# Patient Record
Sex: Male | Born: 1975 | Hispanic: Refuse to answer | Marital: Single | Smoking: Never smoker
Health system: Southern US, Community
[De-identification: ages and names within clinical notes are randomized; demographics above are authoritative.]

## PROBLEM LIST (undated history)

## (undated) HISTORY — PX: MOUTH SURGERY: SHX715

---

## 2010-09-15 ENCOUNTER — Ambulatory Visit (INDEPENDENT_AMBULATORY_CARE_PROVIDER_SITE_OTHER): Payer: Managed Care, Other (non HMO) | Admitting: Internal Medicine

## 2010-09-15 ENCOUNTER — Encounter: Payer: Self-pay | Admitting: Internal Medicine

## 2010-09-15 DIAGNOSIS — N5089 Other specified disorders of the male genital organs: Secondary | ICD-10-CM

## 2010-09-15 DIAGNOSIS — Z0000 Encounter for general adult medical examination without abnormal findings: Secondary | ICD-10-CM | POA: Insufficient documentation

## 2010-09-15 LAB — LIPID PANEL
Cholesterol: 150 mg/dL (ref 0–200)
HDL: 54.2 mg/dL (ref >39.00)
LDL Cholesterol: 89 mg/dL (ref 0–99)
Triglycerides: 34 mg/dL (ref 0.0–149.0)
VLDL: 6.8 mg/dL (ref 0.0–40.0)

## 2010-09-15 LAB — BASIC METABOLIC PANEL
BUN: 13 mg/dL (ref 6–23)
Chloride: 103 mEq/L (ref 96–112)
GFR: 108.2 mL/min (ref >60.00)
Potassium: 4.4 mEq/L (ref 3.5–5.1)

## 2010-09-15 LAB — CBC WITH DIFFERENTIAL/PLATELET
Basophils Absolute: 0 10*3/uL (ref 0.0–0.1)
Eosinophils Absolute: 0 10*3/uL (ref 0.0–0.7)
Lymphocytes Relative: 25.2 % (ref 12.0–46.0)
MCHC: 34.7 g/dL (ref 30.0–36.0)
MCV: 97.3 fl (ref 78.0–100.0)
Monocytes Absolute: 0.4 10*3/uL (ref 0.1–1.0)
Neutrophils Relative %: 66.5 % (ref 43.0–77.0)
Platelets: 173 10*3/uL (ref 150.0–400.0)
RDW: 13.1 % (ref 11.5–14.6)

## 2010-09-15 LAB — TSH: TSH: 1.38 u[IU]/mL (ref 0.35–5.50)

## 2010-09-15 NOTE — Progress Notes (Signed)
  Subjective:    Patient ID: Luis Moore, male    DOB: October 14, 1975, 35 y.o.   MRN: [id number]  HPI new patient, physical exam. A few weeks ago he felt a bump at the right testicle, bump is not there any longer but he still has some heaviness.  No past medical history on file.  Past Surgical History  Procedure Date  . Mouth surgery     gums   History   Social History  . Marital Status: Single    Spouse Name: N/A    Number of Children: 0  . Years of Education: N/A   Occupational History  . financial services sales     Social History Main Topics  . Smoking status: Never Smoker   . Smokeless tobacco: Not on file  . Alcohol Use: Yes     rare  . Drug Use: No  . Sexually Active: Not on file   Other Topics Concern  . Not on file   Social History Narrative   Exercise: none but is active------Lives by himself    Family History  Problem Relation Age of Onset  . Melanoma Father   . Diabetes Father   . Hypertension Father      Review of Systems  Respiratory: Negative for cough and shortness of breath.   Cardiovascular: Negative for chest pain and leg swelling.  Gastrointestinal: Negative for abdominal pain and blood in stool.  Genitourinary: Negative for dysuria, hematuria and difficulty urinating.  Psychiatric/Behavioral:       No anxiety, depression       Objective:   Physical Exam  Constitutional: He appears well-developed and well-nourished. No distress.  HENT:  Head: Normocephalic and atraumatic.  Neck: No thyromegaly present.       Normal carotid pulses  Cardiovascular: Normal rate, regular rhythm and normal heart sounds.   No murmur heard. Pulmonary/Chest: Breath sounds normal. No respiratory distress. He has no wheezes. He has no rales.  Abdominal: Soft. He exhibits no distension. There is no tenderness. There is no rebound.  Genitourinary: Rectum normal and penis normal.       Both testicles are normal. He has some nodularities in the epididymis, more  noticeable on the right (distaly)  Skin: He is not diaphoretic.          Assessment & Plan:

## 2010-09-15 NOTE — Assessment & Plan Note (Signed)
epididymal cyst? U/s to confirm

## 2010-09-15 NOTE — Assessment & Plan Note (Addendum)
Td--declined  Discussed diet-exercise-STE Labs

## 2010-09-20 ENCOUNTER — Ambulatory Visit
Admission: RE | Admit: 2010-09-20 | Discharge: 2010-09-20 | Disposition: A | Discharge status: Home / Self Care | Payer: Managed Care, Other (non HMO) | Source: Ambulatory Visit | Attending: Internal Medicine | Admitting: Internal Medicine

## 2010-09-20 DIAGNOSIS — N5089 Other specified disorders of the male genital organs: Secondary | ICD-10-CM

## 2010-09-21 ENCOUNTER — Telehealth: Payer: Self-pay | Admitting: *Deleted

## 2010-09-21 NOTE — Telephone Encounter (Signed)
Message copied by Army Fossa on Thu Sep 21, 2010  8:47 AM ------      Message from: Luis Moore      Created: Wed Sep 20, 2010  5:27 PM       Advise patient:      Ultrasound is normal, patient to let me know if anything changes

## 2010-09-21 NOTE — Telephone Encounter (Signed)
Message left for patient to return my call.  

## 2010-09-22 NOTE — Telephone Encounter (Signed)
Pt is aware.  

## 2011-12-03 ENCOUNTER — Ambulatory Visit: Payer: Managed Care, Other (non HMO) | Admitting: Family Medicine

## 2011-12-03 VITALS — BP 108/64 | HR 68 | Temp 98.0°F | Resp 16 | Ht 65.0 in | Wt 168.0 lb

## 2011-12-03 DIAGNOSIS — T148 Other injury of unspecified body region: Secondary | ICD-10-CM

## 2011-12-03 DIAGNOSIS — W57XXXA Bitten or stung by nonvenomous insect and other nonvenomous arthropods, initial encounter: Secondary | ICD-10-CM

## 2011-12-03 MED ORDER — CEPHALEXIN 500 MG PO CAPS: 500.0000 mg | ORAL_CAPSULE | Freq: Two times a day (BID) | ORAL | Status: AC

## 2011-12-03 NOTE — Progress Notes (Signed)
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  The area has become reddened rapidly and is tender to touch, though it does not hurt when it is not palpated  O:  Red 1/2 area on superior left scapula with central small puncture site  A:  Insect bite  1. Insect bite  cephALEXin (KEFLEX) 500 MG capsule

## 2011-12-05 ENCOUNTER — Ambulatory Visit: Payer: Managed Care, Other (non HMO)

## 2011-12-10 ENCOUNTER — Other Ambulatory Visit: Payer: Self-pay | Admitting: Family Medicine

## 2012-05-02 IMAGING — US US SCROTUM
1 series · 14 of 25 positions shown · non-contrast
Comparison: None.

CLINICAL DATA: Testicular heaviness.  Question epididymal cyst.

ULTRASOUND OF SCROTUM
TECHNIQUE: Complete ultrasound examination of the testicles,
epididymis, and other scrotal structures was performed.

[Series 1: us scrotum · 0.07mm/px · 14 of 32 slices shown]
[im 1/32]
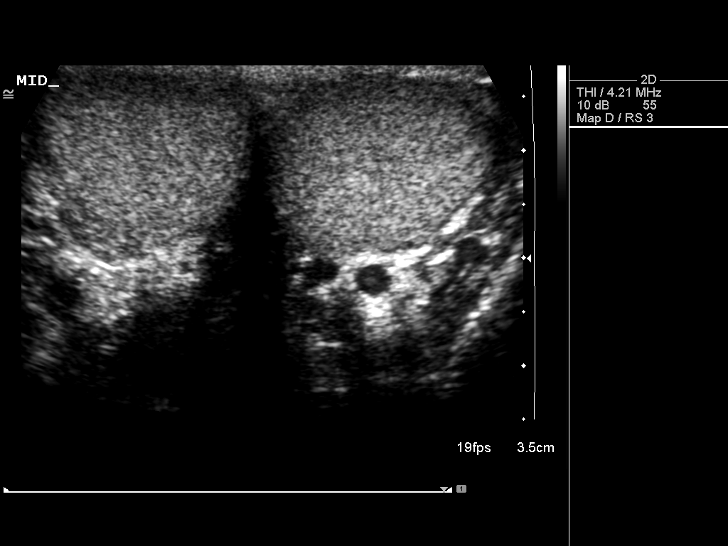
[im 3/32]
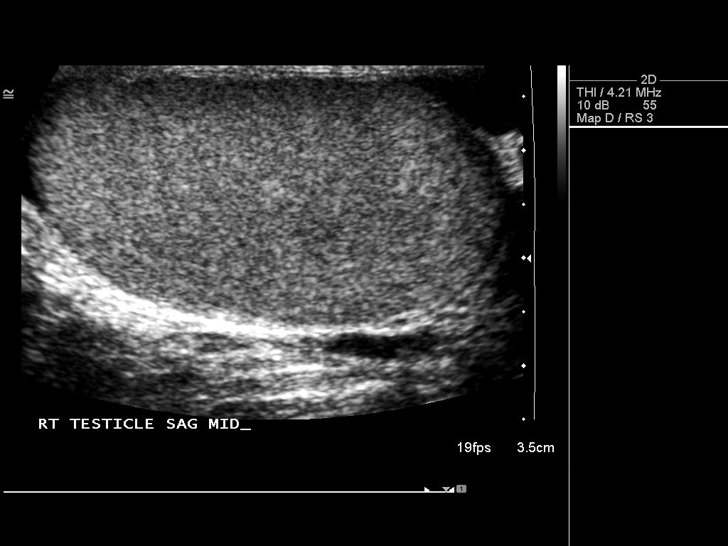
[im 6/32]
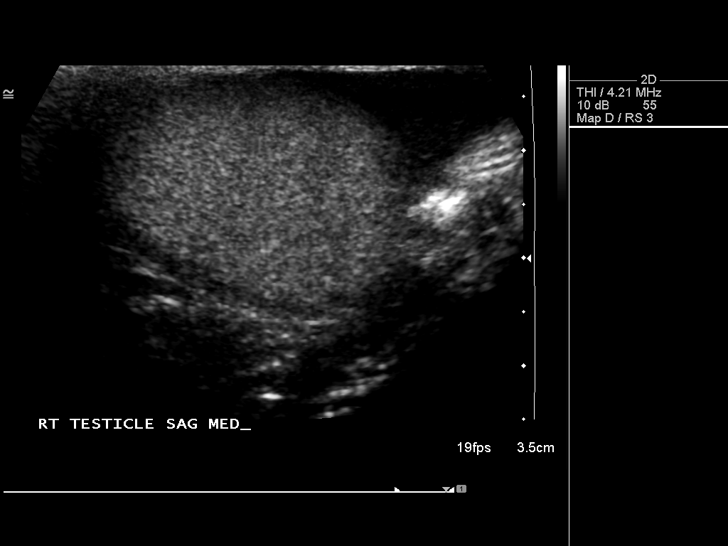
[im 8/32]
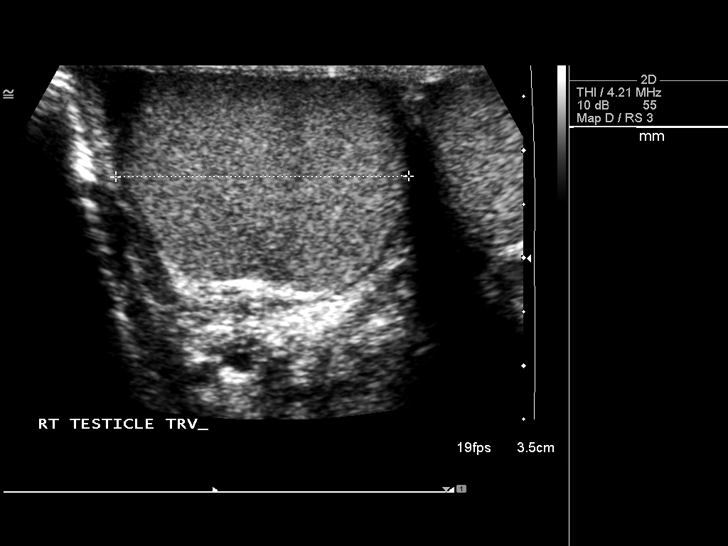
[im 11/32]
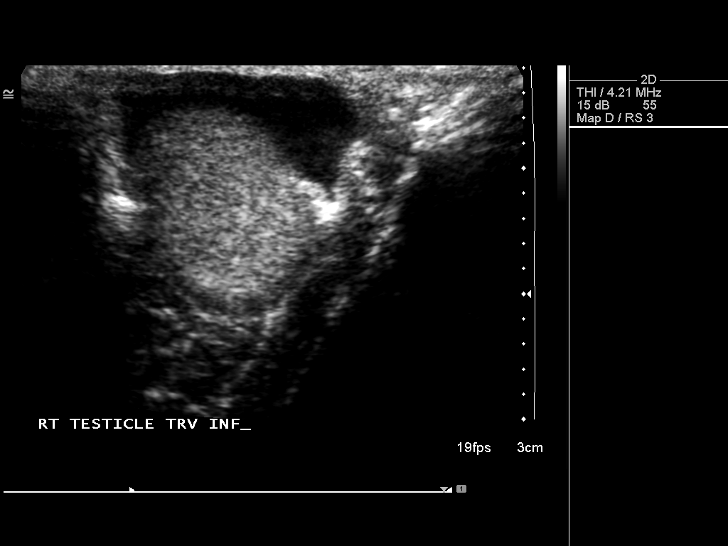
[im 12/32]
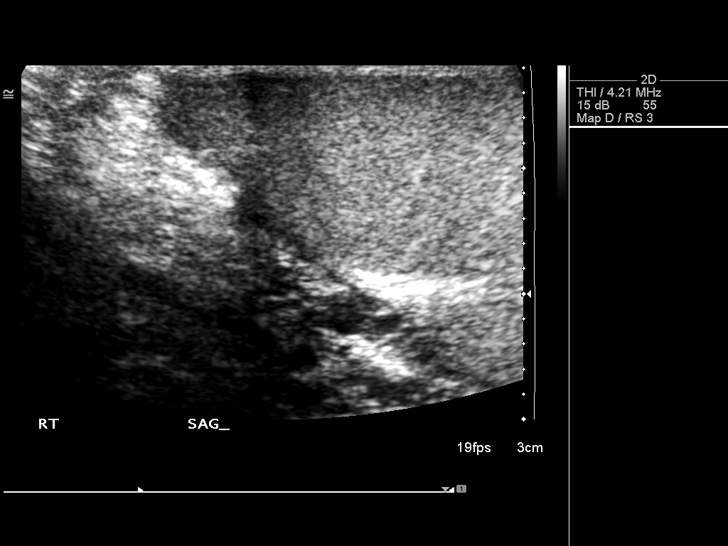
[im 15/32]
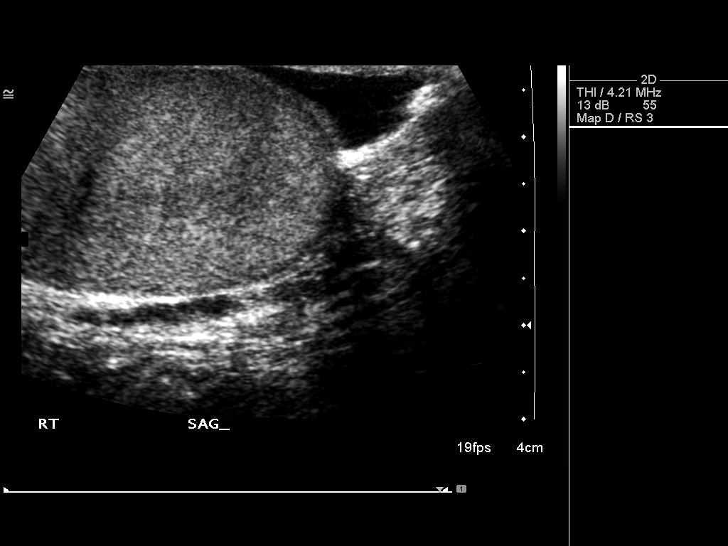
[im 17/32]
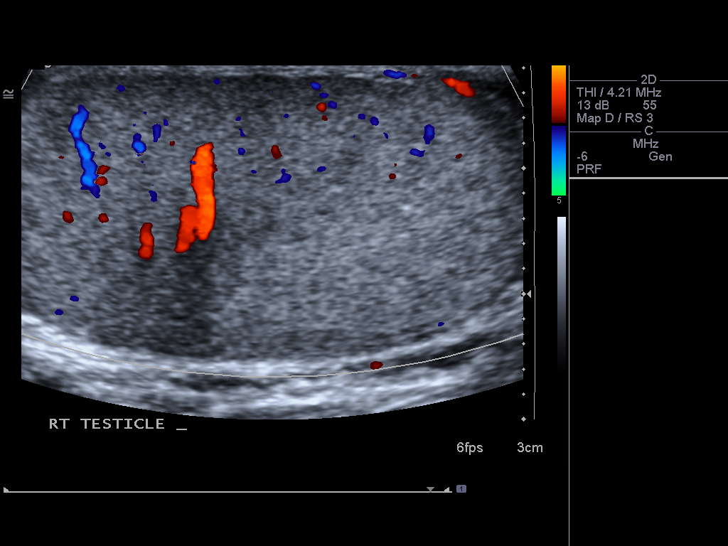
[im 20/32]
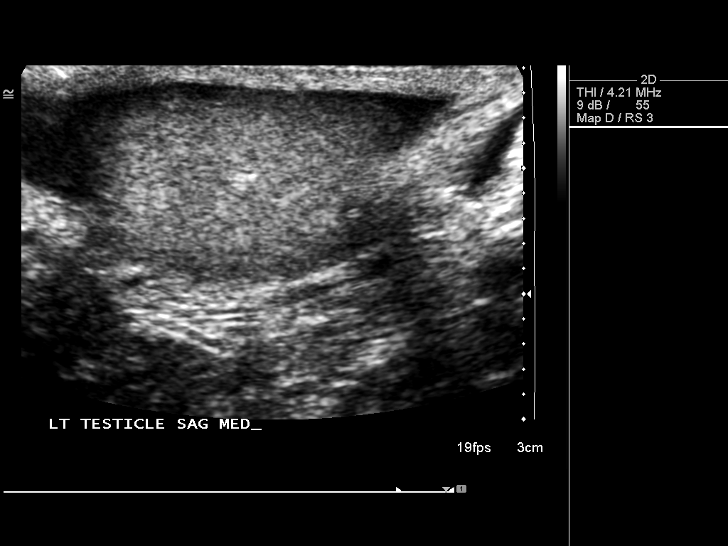
[im 21/32]
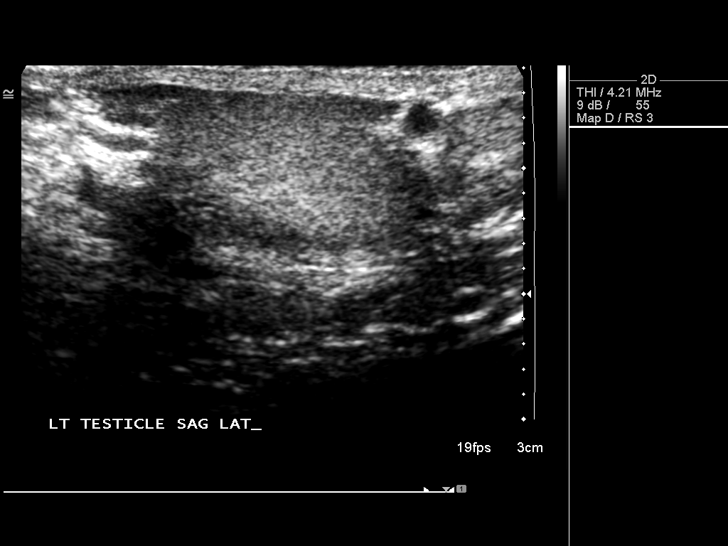
[im 24/32]
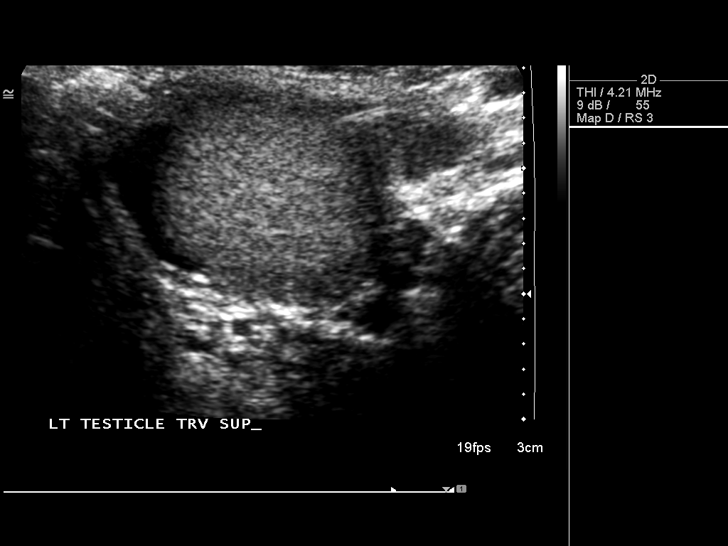
[im 26/32]
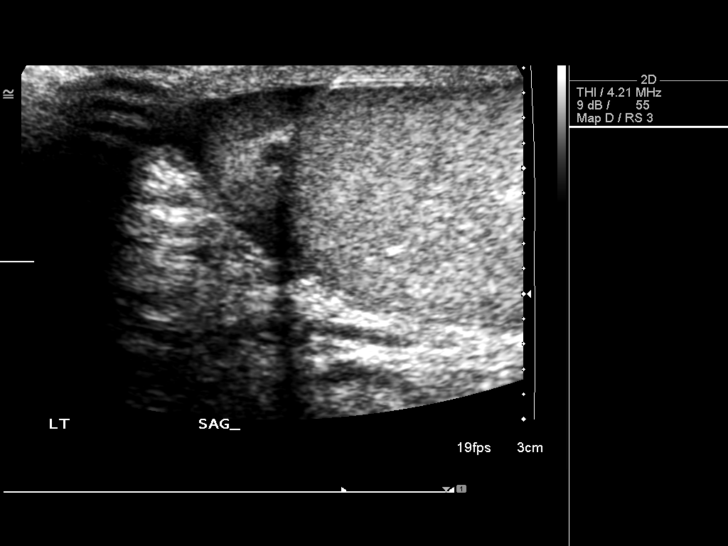
[im 29/32]
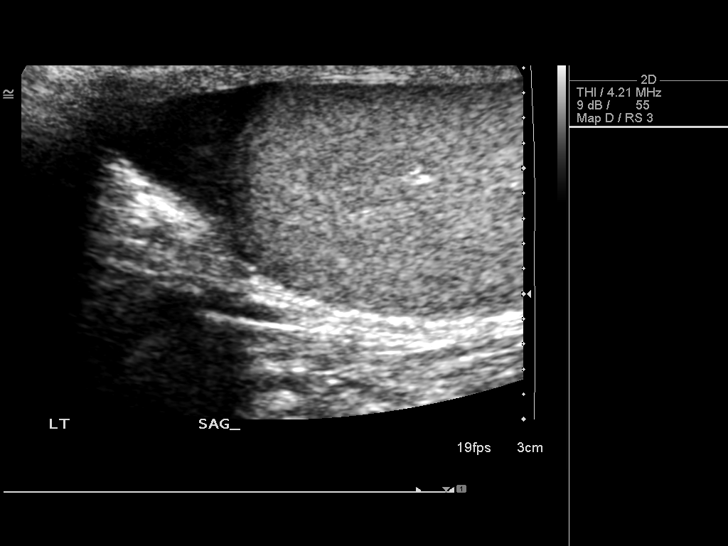
[im 32/32]
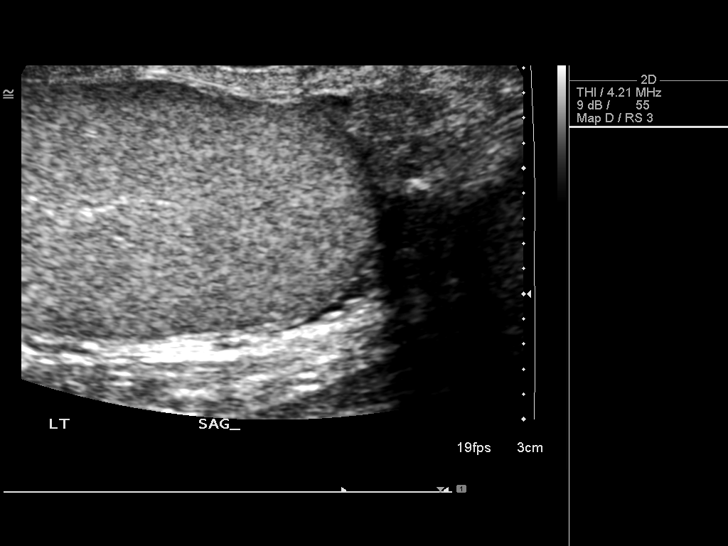

[14 of 25 positions shown; findings below may reference images not displayed]

FINDINGS: Right testis:  4.4 x 2.3 x 2.7 cm.  Normal in gray scale appearance
with normal color Doppler.

Left testis:  3.9 x 2.1 x 2.7 cm.  Normal in gray scale appearance
with normal color Doppler.

Right epididymis:  Normal in size and appearance.

Left epididymis:  Normal in size and appearance.

Hydrocele:  Absent

Varicocele:  Absent
IMPRESSION: Normal scrotal ultrasound.

## 2015-04-23 ENCOUNTER — Ambulatory Visit (INDEPENDENT_AMBULATORY_CARE_PROVIDER_SITE_OTHER): Payer: Managed Care, Other (non HMO) | Admitting: Family Medicine

## 2015-04-23 VITALS — BP 126/84 | HR 91 | Temp 99.5°F | Resp 16 | Ht 66.0 in | Wt 172.0 lb

## 2015-04-23 DIAGNOSIS — J111 Influenza due to unidentified influenza virus with other respiratory manifestations: Secondary | ICD-10-CM | POA: Diagnosis not present

## 2015-04-23 DIAGNOSIS — R6889 Other general symptoms and signs: Secondary | ICD-10-CM | POA: Diagnosis not present

## 2015-04-23 DIAGNOSIS — J029 Acute pharyngitis, unspecified: Secondary | ICD-10-CM

## 2015-04-23 LAB — POCT INFLUENZA A/B
Influenza A, POC: NEGATIVE
Influenza B, POC: POSITIVE — AB

## 2015-04-23 LAB — POCT RAPID STREP A (OFFICE): Rapid Strep A Screen: NEGATIVE

## 2015-04-23 MED ORDER — OSELTAMIVIR PHOSPHATE 75 MG PO CAPS: 75.0000 mg | ORAL_CAPSULE | Freq: Two times a day (BID) | ORAL | Status: AC

## 2015-04-23 NOTE — Progress Notes (Signed)
Chief Complaint:  Chief Complaint  Patient presents with  . Sore Throat    since yesterday  . Headache  . Cough    HPI: Luis Moore is a 39 y.o. male who reports to [facility] today complaining of 2 day hx of sore throat, neck , headache, his skin feesl a little sensitive. Tingling soemtimes. He only has aches in his face. HE has been hydrating , he is not having ear, subjective fevers, chills.  This feels very similar to when he had the flu. Wants to get it checked.   History reviewed. No pertinent past medical history. Past Surgical History  Procedure Laterality Date  . Mouth surgery      gums   Social History   Social History  . Marital Status: Single    Spouse Name: N/A  . Number of Children: 0  . Years of Education: N/A   Occupational History  . financial services sales     Social History Main Topics  . Smoking status: Never Smoker   . Smokeless tobacco: None  . Alcohol Use: Yes     Comment: rare  . Drug Use: No  . Sexual Activity: Not Asked   Other Topics Concern  . None   Social History Narrative   Exercise: none but is active------   Lives by himself    Family History  Problem Relation Age of Onset  . Melanoma Father   . Diabetes Father   . Hypertension Father    No Known Allergies Prior to Admission medications   Not on File     ROS: The patient denies  night sweats, unintentional weight loss, chest pain, palpitations, wheezing, dyspnea on exertion, nausea, vomiting, abdominal pain, dysuria, hematuria, melena, numbness,  or tingling.   All other systems have been reviewed and were otherwise negative with the exception of those mentioned in the HPI and as above.    PHYSICAL EXAM: Filed Vitals:   04/23/15 1146  BP: 126/84  Pulse: 91  Temp: 99.5 F (37.5 C)  Resp: 16   Body mass index is 27.77 kg/(m^2).   General: Alert, no acute distress HEENT:  Normocephalic, atraumatic, oropharynx patent. EOMI, PERRLA Erythematous throat, no  exudates, TM normal, +/- sinus tenderness, + erythematous/boggy nasal mucosa Cardiovascular:  Regular rate and rhythm, no rubs murmurs or gallops.  No Carotid bruits, radial pulse intact. No pedal edema.  Respiratory: Clear to auscultation bilaterally.  No wheezes, rales, or rhonchi.  No cyanosis, no use of accessory musculature Abdominal: No organomegaly, abdomen is soft and non-tender, positive bowel sounds. No masses. Skin: No rashes. Neurologic: Facial musculature symmetric. Psychiatric: Patient acts appropriately throughout our interaction. Lymphatic: No cervical or submandibular lymphadenopathy Musculoskeletal: Gait intact. No edema, tenderness   LABS: Results for orders placed or performed in visit on 04/23/15  POCT rapid strep A  Result Value Ref Range   Rapid Strep A Screen Negative Negative  POCT Influenza A/B  Result Value Ref Range   Influenza A, POC Negative Negative   Influenza B, POC Positive (A) Negative     EKG/XRAY:   Primary read interpreted by Dr. Conley Rolls at [facility].   ASSESSMENT/PLAN: Encounter Diagnoses  Name Primary?  . Acute pharyngitis, unspecified pharyngitis type Yes  . Flu-like symptoms   . Influenza with respiratory manifestation    Rx Tamiflu OTC treatment for other sxs Push fluids, rest Fu prn   Gross sideeffects, risk and benefits, and alternatives of medications d/w patient. Patient is aware that  all medications have potential sideeffects and we are unable to predict every sideeffect or drug-drug interaction that may occur.  Alexiss Iturralde DO  04/23/2015 1:10 PM

## 2015-04-23 NOTE — Patient Instructions (Signed)
Influenza, Adult °Influenza ("the flu") is a viral infection of the respiratory tract. It occurs more often in winter months because people spend more time in close contact with one another. Influenza can make you feel very sick. Influenza easily spreads from person to person (contagious). °CAUSES  °Influenza is caused by a virus that infects the respiratory tract. You can catch the virus by breathing in droplets from an infected person's cough or sneeze. You can also catch the virus by touching something that was recently contaminated with the virus and then touching your mouth, nose, or eyes. °RISKS AND COMPLICATIONS °You may be at risk for a more severe case of influenza if you smoke cigarettes, have diabetes, have chronic heart disease (such as heart failure) or lung disease (such as asthma), or if you have a weakened immune system. Elderly people and pregnant women are also at risk for more serious infections. The most common problem of influenza is a lung infection (pneumonia). Sometimes, this problem can require emergency medical care and may be life threatening. °SIGNS AND SYMPTOMS  °Symptoms typically last 4 to 10 days and may include: °· Fever. °· Chills. °· Headache, body aches, and muscle aches. °· Sore throat. °· Chest discomfort and cough. °· Poor appetite. °· Weakness or feeling tired. °· Dizziness. °· Nausea or vomiting. °DIAGNOSIS  °Diagnosis of influenza is often made based on your history and a physical exam. A nose or throat swab test can be done to confirm the diagnosis. °TREATMENT  °In mild cases, influenza goes away on its own. Treatment is directed at relieving symptoms. For more severe cases, your health care provider may prescribe antiviral medicines to shorten the sickness. Antibiotic medicines are not effective because the infection is caused by a virus, not by bacteria. °HOME CARE INSTRUCTIONS °· Take medicines only as directed by your health care provider. °· Use a cool mist humidifier  to make breathing easier. °· Get plenty of rest until your temperature returns to normal. This usually takes 3 to 4 days. °· Drink enough fluid to keep your urine clear or pale yellow. °· Cover your mouth and nose when coughing or sneezing, and wash your hands well to prevent the virus from spreading. °· Stay home from work or school until the fever is gone for at least 1 full day. °PREVENTION  °An annual influenza vaccination (flu shot) is the best way to avoid getting influenza. An annual flu shot is now routinely recommended for all adults in the [place] °SEEK MEDICAL CARE IF: °· You experience chest pain, your cough worsens, or you produce more mucus. °· You have nausea, vomiting, or diarrhea. °· Your fever returns or gets worse. °SEEK IMMEDIATE MEDICAL CARE IF: °· You have trouble breathing, you become short of breath, or your skin or nails become bluish. °· You have severe pain or stiffness in the neck. °· You develop a sudden headache, or pain in the face or ear. °· You have nausea or vomiting that you cannot control. °MAKE SURE YOU:  °· Understand these instructions. °· Will watch your condition. °· Will get help right away if you are not doing well or get worse. °  °This information is not intended to replace advice given to you by your health care provider. Make sure you discuss any questions you have with your health care provider. °  °Document Released: 04/13/2000 Document Revised: 05/07/2014 Document Reviewed: 07/16/2011 °Elsevier Interactive Patient Education ©2016 [employer] ° °Oseltamivir capsules °What is this medicine? °OSELTAMIVIR (os el TAM i vir) is an antiviral medicine. It is used to prevent and to treat some kinds of influenza or the flu. It will not   work for colds or other viral infections. °This medicine may be used for other purposes; ask your health care provider or pharmacist if you have questions. °What should I tell my health care provider before I take this medicine? °They need to  know if you have any of the following conditions: °-heart disease °-immune system problems °-kidney disease °-liver disease °-lung disease °-an unusual or allergic reaction to oseltamivir, other medicines, foods, dyes, or preservatives °-pregnant or trying to get pregnant °-breast-feeding °How should I use this medicine? °Take this medicine by mouth with a glass of water. Follow the directions on the prescription label. Start this medicine at the first sign of flu symptoms. You can take it with or without food. If it upsets your stomach, take it with food. Take your medicine at regular intervals. Do not take your medicine more often than directed. Take all of your medicine as directed even if you think you are better. Do not skip doses or stop your medicine early. °Talk to your pediatrician regarding the use of this medicine in children. While this drug may be prescribed for children as young as 14 days for selected conditions, precautions do apply. °Overdosage: If you think you have taken too much of this medicine contact a poison control center or emergency room at once. °NOTE: This medicine is only for you. Do not share this medicine with others. °What if I miss a dose? °If you miss a dose, take it as soon as you remember. If it is almost time for your next dose (within 2 hours), take only that dose. Do not take double or extra doses. °What may interact with this medicine? °Interactions are not expected. °This list may not describe all possible interactions. Give your health care provider a list of all the medicines, herbs, non-prescription drugs, or dietary supplements you use. Also tell them if you smoke, drink alcohol, or use illegal drugs. Some items may interact with your medicine. °What should I watch for while using this medicine? °Visit your doctor or health care professional for regular check ups. Tell your doctor if your symptoms do not start to get better or if they get worse. °If you have the flu, you  may be at an increased risk of developing seizures, confusion, or abnormal behavior. This occurs early in the illness, and more frequently in children and teens. These events are not common, but may result in accidental injury to the patient. Families and caregivers of patients should watch for signs of unusual behavior and contact a doctor or health care professional right away if the patient shows signs of unusual behavior. °This medicine is not a substitute for the flu shot. Talk to your doctor each year about an annual flu shot. °What side effects may I notice from receiving this medicine? °Side effects that you should report to your doctor or health care professional as soon as possible: °-allergic reactions like skin rash, itching or hives, swelling of the face, lips, or tongue °-anxiety, confusion, unusual behavior °-breathing problems °-hallucination, loss of contact with reality °-redness, blistering, peeling or loosening of the skin, including inside the mouth °-seizures °Side effects that usually do not require medical attention (report to your doctor or health care professional if they continue or are bothersome): °-diarrhea °-headache °-nausea, vomiting °-pain °This list may not describe all possible side effects. Call your doctor for medical advice about side effects. You may report side effects to FDA at 1-800-FDA-1088. °Where should I keep my medicine? °Keep out   of the reach of children. °Store at room temperature between 15 and 30 degrees C (59 and 86 degrees F). Throw away any unused medicine after the expiration date. °NOTE: This sheet is a summary. It may not cover all possible information. If you have questions about this medicine, talk to your doctor, pharmacist, or health care provider. °  °© 2016, Elsevier/[employer]. (2014-10-20 10:50:39) ° °

## 2015-04-29 ENCOUNTER — Telehealth: Payer: Self-pay

## 2015-04-29 NOTE — Telephone Encounter (Signed)
Yes extend note

## 2015-04-29 NOTE — Telephone Encounter (Signed)
Message is for Dr. Conley RollsLe pt needs a extended out of work note until the Wednesday the 4th if possible. He's just now starting to feel better. He needs this by Monday.  Please advise  [phone]

## 2015-04-29 NOTE — Telephone Encounter (Signed)
Ok to extend note 

## 2015-05-02 NOTE — Telephone Encounter (Signed)
Letter ready to pick up. Pt advised.
